# Patient Record
Sex: Male | Born: 1995 | Race: Black or African American | Hispanic: No | Marital: Single | Smoking: Never smoker
Health system: Southern US, Community
[De-identification: ages and names within clinical notes are randomized; demographics above are authoritative.]

## PROBLEM LIST (undated history)

## (undated) DIAGNOSIS — J45909 Unspecified asthma, uncomplicated: Secondary | ICD-10-CM

---
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
## 2017-10-07 IMAGING — CR DG CHEST 2V
2 series · 2 of 2 positions shown · non-contrast
Comparison: [DATE].

CLINICAL DATA: Central chest pain, nausea.

EXAM:
CHEST  2 VIEW

[w chest pa]
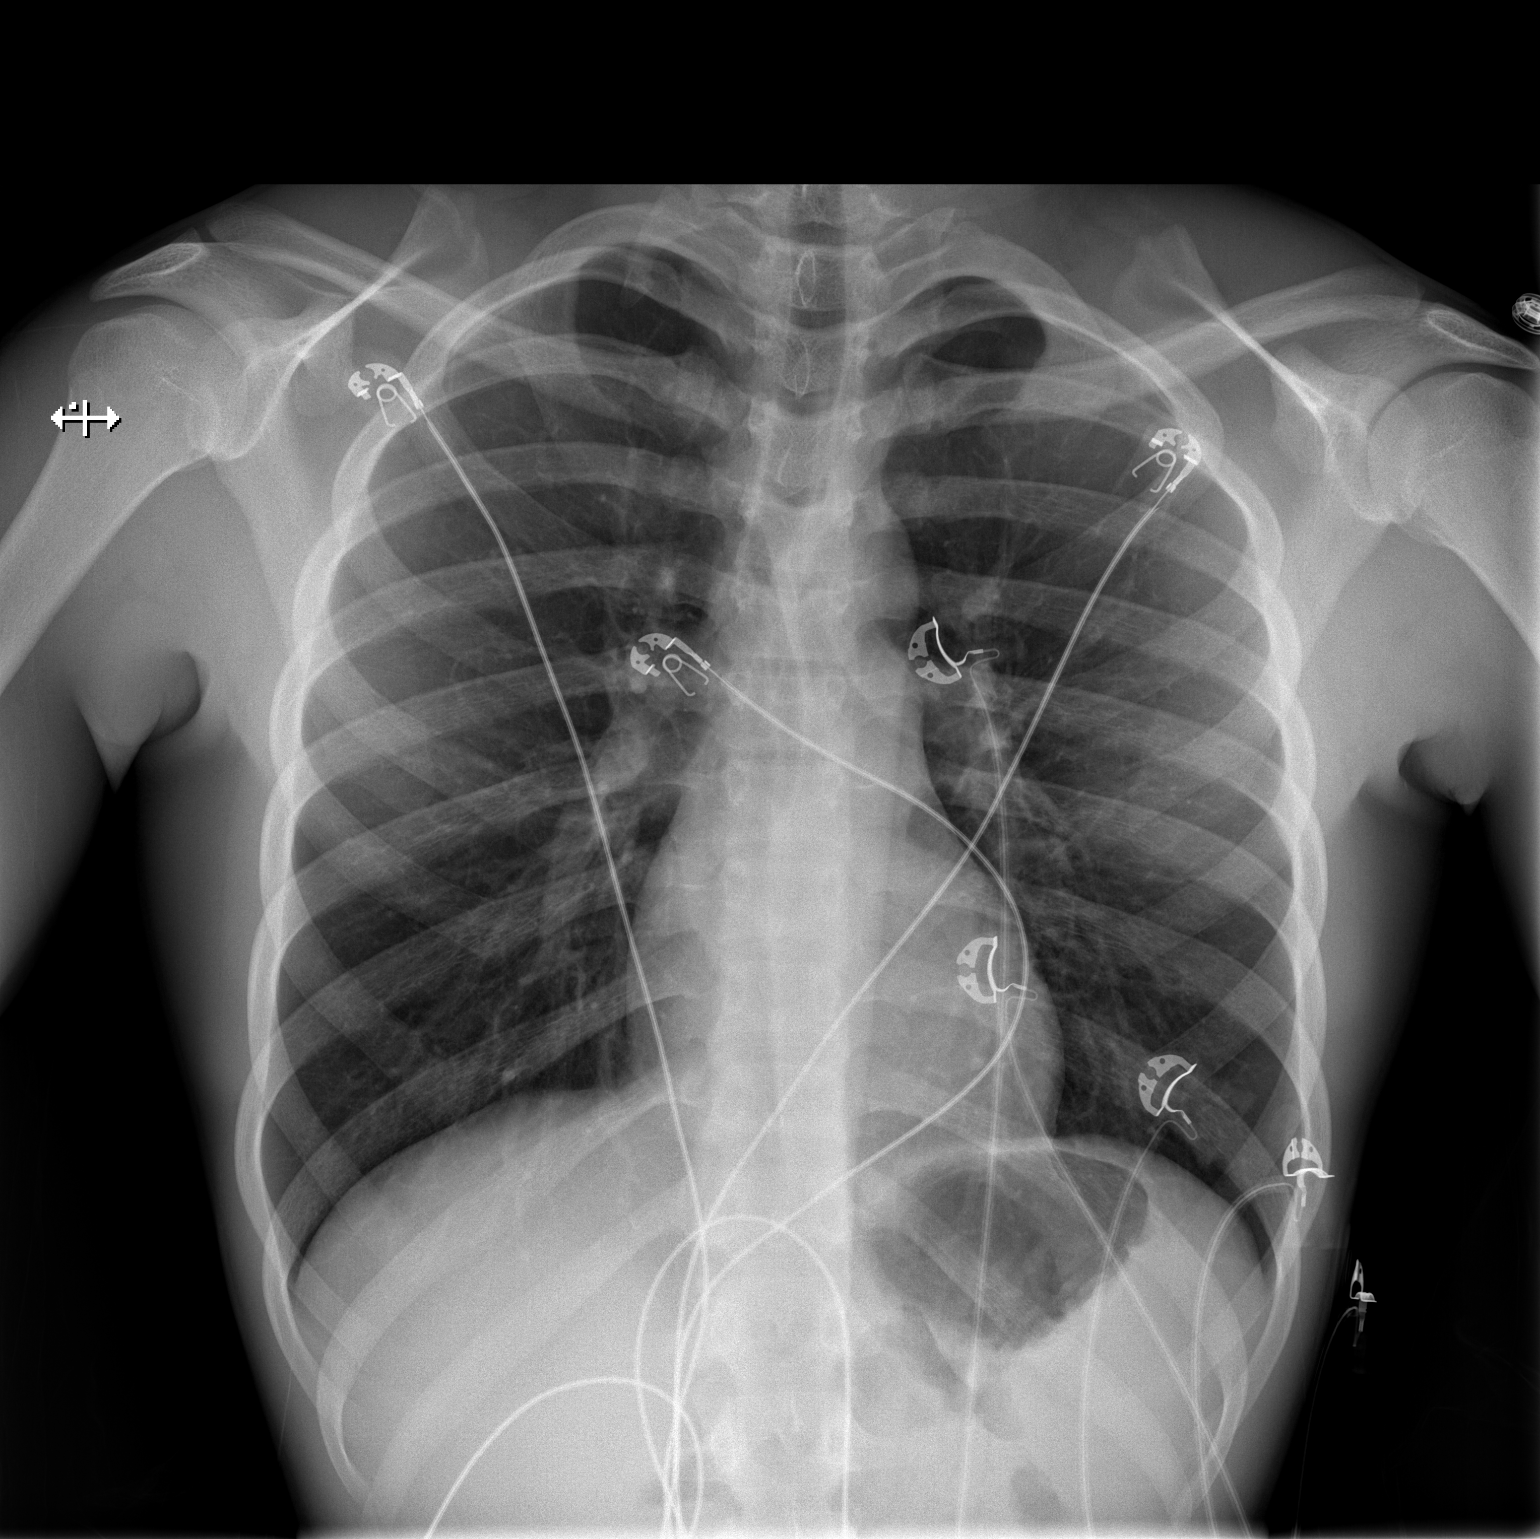

[w chest lat]
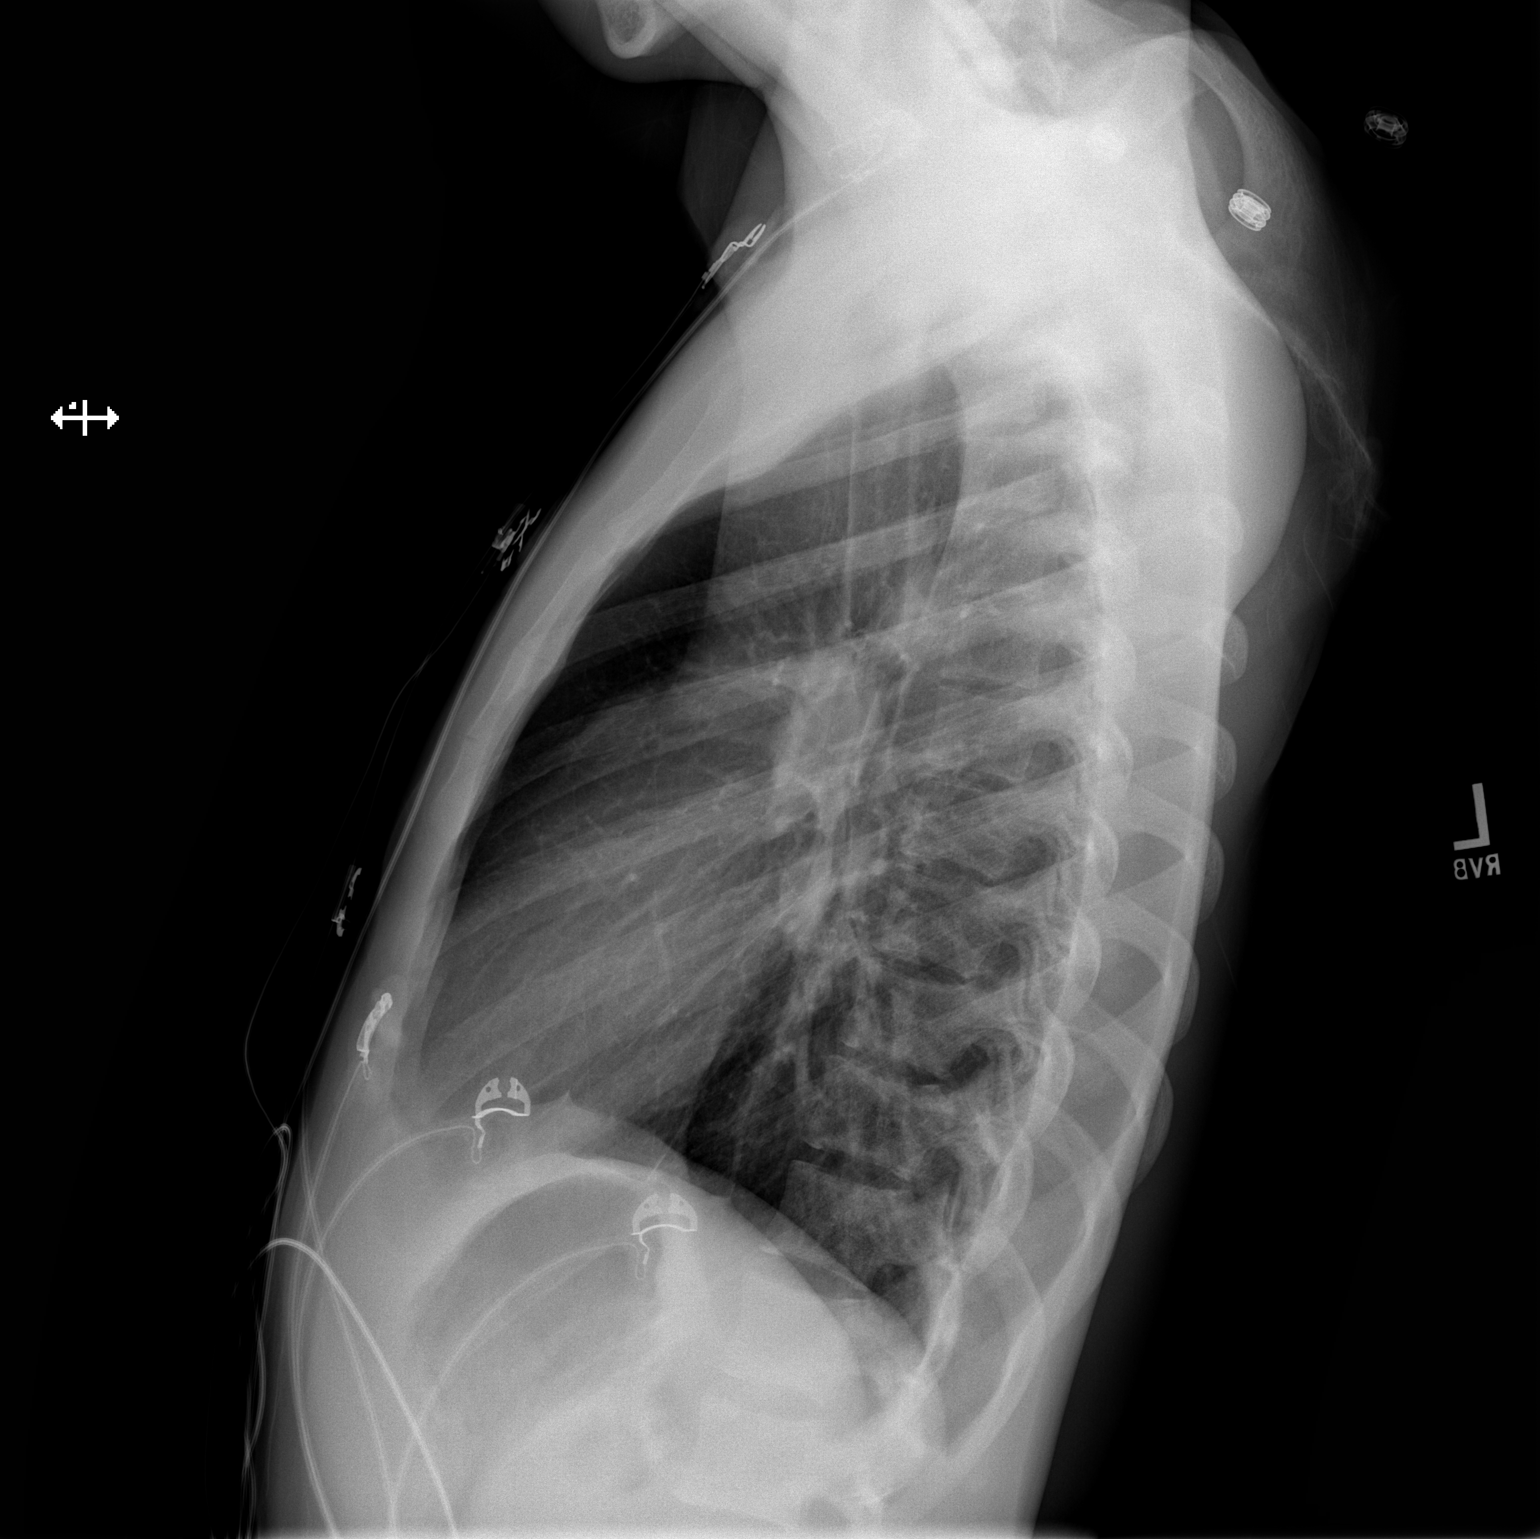

[2 of 2 positions shown; findings below may reference images not displayed]

FINDINGS: Trachea is midline. Heart size normal. Lungs are clear. No pleural
fluid.
IMPRESSION: Negative.

## 2017-11-24 ENCOUNTER — Other Ambulatory Visit: Payer: Self-pay

## 2017-11-24 ENCOUNTER — Encounter (HOSPITAL_COMMUNITY): Payer: Self-pay | Admitting: Emergency Medicine

## 2017-11-24 ENCOUNTER — Ambulatory Visit (HOSPITAL_COMMUNITY)
Admission: EM | Admit: 2017-11-24 | Discharge: 2017-11-24 | Disposition: A | Discharge status: Home / Self Care | Payer: BLUE CROSS/BLUE SHIELD | Attending: Family Medicine | Admitting: Family Medicine

## 2017-11-24 DIAGNOSIS — M25562 Pain in left knee: Secondary | ICD-10-CM | POA: Diagnosis not present

## 2017-11-24 MED ORDER — MELOXICAM 7.5 MG PO TABS: 7.5000 mg | ORAL_TABLET | Freq: Every day | ORAL | 0 refills | Status: AC

## 2017-11-24 NOTE — ED Provider Notes (Signed)
[facility]    CSN: [id number] Arrival date & time: 11/24/17  1333     History   Chief Complaint Chief Complaint  Patient presents with  . Knee Injury    left    HPI Mason Ward is a 22 y.o. male.   22 year old male comes in for 2-day history of left knee pain after injury.  States he was climbing up stairs when 1 of the stair gave way/broke down, causing him to fall.  States was able to catch the fall, but twisted his left knee during the process.  Has still been able to ambulate though with some pain.  Points to the superior knee when asking about pain.  No obvious swelling, contusions.  Has been taking ibuprofen with some relief.  Was able to go to work yesterday despite incident.     Past Medical History:  Diagnosis Date  . Asthma     There are no active problems to display for this patient.   History reviewed. No pertinent surgical history.     Home Medications    Prior to Admission medications   Medication Sig Start Date End Date Taking? Authorizing Provider  Loratadine (CLARITIN) 10 MG CAPS Take by mouth.   Yes [provider]  montelukast (SINGULAIR) 10 MG tablet Take 10 mg by mouth at bedtime.   Yes [provider]  benzonatate (TESSALON) 100 MG capsule Take 1 capsule (100 mg total) by mouth 3 (three) times daily as needed for cough. 04/14/16   Alvina ChouEspina, Francisco Manuel, PA  meloxicam (MOBIC) 7.5 MG tablet Take 1 tablet (7.5 mg total) by mouth daily. 11/24/17   Belinda FisherYu, Artemis Koller V, PA-C    Family History History reviewed. No pertinent family history.  Social History Social History   Tobacco Use  . Smoking status: Never Smoker  . Smokeless tobacco: Never Used  Substance Use Topics  . Alcohol use: No  . Drug use: Not on file     Allergies   Patient has no known allergies.   Review of Systems Review of Systems  Reason unable to perform ROS: See HPI as above.     Physical Exam Triage Vital Signs ED Triage Vitals  Enc  Vitals Group     BP 11/24/17 1401 127/84     Pulse Rate 11/24/17 1401 64     Resp --      Temp 11/24/17 1401 98 F (36.7 C)     Temp Source 11/24/17 1401 Oral     SpO2 11/24/17 1401 100 %     Weight --      Height --      Head Circumference --      Peak Flow --      Pain Score 11/24/17 1402 7     Pain Loc --      Pain Edu? --      Excl. in GC? --    No data found.  Updated Vital Signs BP 127/84 (BP Location: Left Arm)   Pulse 64   Temp 98 F (36.7 C) (Oral)   SpO2 100%   Physical Exam  Constitutional: He is oriented to person, place, and time. He appears well-developed and well-nourished. No distress.  HENT:  Head: Normocephalic and atraumatic.  Eyes: Pupils are equal, round, and reactive to light. Conjunctivae are normal.  Musculoskeletal:  No swelling, erythema, increased warmth,  contusion seen.  Tenderness to palpation superior to patellar. No tenderness to palpation of joint lines. Full ROM of knee.  Strength normal and equal bilaterally. Sensation intact and equal bilaterally. Pedal pulse 2+.   Neurological: He is alert and oriented to person, place, and time.  Skin: He is not diaphoretic.   UC Treatments / Results  Labs (all labs ordered are listed, but only abnormal results are displayed) Labs Reviewed - No data to display  EKG None  Radiology No results found.  Procedures Procedures (including critical care time)  Medications Ordered in UC Medications - No data to display  Initial Impression / Assessment and Plan / UC Course  I have reviewed the triage vital signs and the nursing notes.  Pertinent labs & imaging results that were available during my care of the patient were reviewed by me and considered in my medical decision making (see chart for details).    Patient able to ambulate on own, strength normal and equal bilaterally, no indications for x-ray.  Will have patient try NSAIDs, ice compress, elevation, rest, knee sleeve during activity.   Return precautions given.  Patient expresses understanding and agrees to plan.  Final Clinical Impressions(s) / UC Diagnoses   Final diagnoses:  Acute pain of left knee    ED Prescriptions    Medication Sig Dispense Auth. Provider   meloxicam (MOBIC) 7.5 MG tablet Take 1 tablet (7.5 mg total) by mouth daily. 15 tablet Threasa Alpha, [place] 11/24/17 1501

## 2017-11-24 NOTE — Discharge Instructions (Signed)
Given you are able to walk and good strength of your knee, no xray is indicated. Start mobic, ice compress, elevation, knee sleeve during activity, rest. This may take a few weeks to completely resolve, but should be feeling better each week. Follow up with orthopedics/PCP for further evaluation if symptoms not improving.

## 2017-11-24 NOTE — ED Triage Notes (Signed)
Pt was walking into his house when his stairs gave way.  Pt states he twisted his left knee.

## 2018-03-27 ENCOUNTER — Encounter (HOSPITAL_BASED_OUTPATIENT_CLINIC_OR_DEPARTMENT_OTHER): Payer: Self-pay | Admitting: Emergency Medicine

## 2018-03-27 ENCOUNTER — Other Ambulatory Visit: Payer: Self-pay

## 2018-03-27 ENCOUNTER — Emergency Department (HOSPITAL_BASED_OUTPATIENT_CLINIC_OR_DEPARTMENT_OTHER)
Admission: EM | Admit: 2018-03-27 | Discharge: 2018-03-27 | Disposition: A | Discharge status: Home / Self Care | Payer: BLUE CROSS/BLUE SHIELD | Attending: Emergency Medicine | Admitting: Emergency Medicine

## 2018-03-27 ENCOUNTER — Emergency Department (HOSPITAL_BASED_OUTPATIENT_CLINIC_OR_DEPARTMENT_OTHER): Payer: BLUE CROSS/BLUE SHIELD

## 2018-03-27 DIAGNOSIS — S60012A Contusion of left thumb without damage to nail, initial encounter: Secondary | ICD-10-CM | POA: Diagnosis not present

## 2018-03-27 DIAGNOSIS — Y929 Unspecified place or not applicable: Secondary | ICD-10-CM | POA: Diagnosis not present

## 2018-03-27 DIAGNOSIS — Y939 Activity, unspecified: Secondary | ICD-10-CM | POA: Diagnosis not present

## 2018-03-27 DIAGNOSIS — J45909 Unspecified asthma, uncomplicated: Secondary | ICD-10-CM | POA: Diagnosis not present

## 2018-03-27 DIAGNOSIS — Z79899 Other long term (current) drug therapy: Secondary | ICD-10-CM | POA: Diagnosis not present

## 2018-03-27 DIAGNOSIS — W230XXA Caught, crushed, jammed, or pinched between moving objects, initial encounter: Secondary | ICD-10-CM | POA: Insufficient documentation

## 2018-03-27 DIAGNOSIS — Y999 Unspecified external cause status: Secondary | ICD-10-CM | POA: Insufficient documentation

## 2018-03-27 DIAGNOSIS — S6010XA Contusion of unspecified finger with damage to nail, initial encounter: Secondary | ICD-10-CM

## 2018-03-27 DIAGNOSIS — S6992XA Unspecified injury of left wrist, hand and finger(s), initial encounter: Secondary | ICD-10-CM | POA: Diagnosis present

## 2018-03-27 MED ORDER — IBUPROFEN 800 MG PO TABS
800.0000 mg | ORAL_TABLET | Freq: Once | ORAL | Status: AC
  Administered 2018-03-27: 800 mg via ORAL
  Filled 2018-03-27: qty 1

## 2018-03-27 MED ORDER — IBUPROFEN 800 MG PO TABS: 800.0000 mg | ORAL_TABLET | Freq: Three times a day (TID) | ORAL | 0 refills | Status: AC | PRN

## 2018-03-27 NOTE — ED Triage Notes (Signed)
Patient states a box fell onto his left thumb yesterday at 1600; swelling and redness noted.

## 2018-03-27 NOTE — Discharge Instructions (Addendum)
Wear the splint and follow-up with a hand doctor.  Keep hand elevated, apply ice, anti-inflammatories.  You declined drainage of the bleeding under the nail today.  Return to the ED with new or worsening symptoms.

## 2018-03-27 NOTE — ED Provider Notes (Signed)
[facility] EMERGENCY DEPARTMENT Provider Note   CSN: [id number] Arrival date & time: 03/27/18  0515     History   Chief Complaint Chief Complaint  Patient presents with  . Hand Pain    HPI Mason Ward is a 21 y.o. male.  Patient reports left thumb pain after "30 pound box" fell on his thumb around 4 PM.  Denies having any pain before this.  Has been taking Motrin without relief.  There is some discoloration to his thumbnail.  He has decreased range of motion and pain.  There is no numbness or tingling.  No other injuries.  The history is provided by the patient.  Hand Pain  Pertinent negatives include no chest pain, no abdominal pain and no shortness of breath.    Past Medical History:  Diagnosis Date  . Asthma     There are no active problems to display for this patient.   History reviewed. No pertinent surgical history.      Home Medications    Prior to Admission medications   Medication Sig Start Date End Date Taking? Authorizing Provider  benzonatate (TESSALON) 100 MG capsule Take 1 capsule (100 mg total) by mouth 3 (three) times daily as needed for cough. 04/14/16   Espina, Lucita Lora, PA  Loratadine (CLARITIN) 10 MG CAPS Take by mouth.    [provider]  meloxicam (MOBIC) 7.5 MG tablet Take 1 tablet (7.5 mg total) by mouth daily. 11/24/17   Cathie Hoops, Amy V, PA-C  montelukast (SINGULAIR) 10 MG tablet Take 10 mg by mouth at bedtime.    [provider]    Family History History reviewed. No pertinent family history.  Social History Social History   Tobacco Use  . Smoking status: Never Smoker  . Smokeless tobacco: Never Used  Substance Use Topics  . Alcohol use: No  . Drug use: Not on file     Allergies   Patient has no known allergies.   Review of Systems Review of Systems  Constitutional: Negative for activity change, appetite change and fever.  HENT: Negative for congestion and rhinorrhea.   Respiratory:  Negative for chest tightness and shortness of breath.   Cardiovascular: Negative for chest pain.  Gastrointestinal: Negative for abdominal pain, nausea and vomiting.  Genitourinary: Negative for dysuria and hematuria.  Musculoskeletal: Positive for arthralgias and myalgias.  Skin: Negative for rash.  Neurological: Negative for dizziness, light-headedness and numbness.   all other systems are negative except as noted in the HPI and PMH.     Physical Exam Updated Vital Signs BP (!) 134/91 (BP Location: Right Arm)   Pulse 73   Temp 98.3 F (36.8 C) (Oral)   Resp 16   Ht 5\' 10"  (1.778 m)   Wt 61.2 kg   SpO2 100%   BMI 19.37 kg/m   Physical Exam  Constitutional: He is oriented to person, place, and time. He appears well-developed and well-nourished. No distress.  HENT:  Head: Normocephalic and atraumatic.  Mouth/Throat: Oropharynx is clear and moist. No oropharyngeal exudate.  Eyes: Pupils are equal, round, and reactive to light. Conjunctivae and EOM are normal.  Neck: Normal range of motion. Neck supple.  No meningismus.  Cardiovascular: Normal rate, regular rhythm, normal heart sounds and intact distal pulses.  No murmur heard. Pulmonary/Chest: Effort normal and breath sounds normal. No respiratory distress.  Abdominal: Soft. There is no tenderness. There is no rebound and no guarding.  Musculoskeletal: Normal range of motion. He exhibits tenderness. He  exhibits no edema.  Left thumb distal phalanx is tender to palpation with reduced range of motion of the IP joint.  There is no pain at base of first metacarpal or snuffbox. There is subungual hematoma over proximal one third of left thumbnail  Neurological: He is alert and oriented to person, place, and time. No cranial nerve deficit. He exhibits normal muscle tone. Coordination normal.  No ataxia on finger to nose bilaterally. No pronator drift. 5/5 strength throughout. CN 2-12 intact.Equal grip strength. Sensation intact.     Skin: Skin is warm.  Psychiatric: He has a normal mood and affect. His behavior is normal.  Nursing note and vitals reviewed.    ED Treatments / Results  Labs (all labs ordered are listed, but only abnormal results are displayed) Labs Reviewed - No data to display  EKG None  Radiology Dg Finger Thumb Left  Result Date: 03/27/2018 CLINICAL DATA:  Box fell on patient's left thumb yesterday, with pain, swelling and erythema. Initial encounter. EXAM: LEFT THUMB 2+V COMPARISON:  None. FINDINGS: There is no evidence of fracture or dislocation. Visualized joint spaces are preserved. Known soft tissue injury is not well characterized on radiograph. No radiopaque foreign bodies are seen. IMPRESSION: No evidence of fracture or dislocation. Electronically Signed   By: Roanna RaiderJeffery  Chang M.D.   On: 03/27/2018 06:07    Procedures Procedures (including critical care time)  Medications Ordered in ED Medications - No data to display   Initial Impression / Assessment and Plan / ED Course  I have reviewed the triage vital signs and the nursing notes.  Pertinent labs & imaging results that were available during my care of the patient were reviewed by me and considered in my medical decision making (see chart for details).    Blunt trauma with subungual hematoma. Neurovascularly intact.   X-rays negative for fracture or dislocation. Patient declines trephination of subungual hematoma at this time.  He will be given splint, ice, elevation, hand follow-up.  Return precautions discussed.  Final Clinical Impressions(s) / ED Diagnoses   Final diagnoses:  Contusion of left thumb without damage to nail, initial encounter  Subungual hematoma of digit of hand, initial encounter    ED Discharge Orders    None       Jaree Trinka, Jeannett SeniorStephen, MD 03/27/18 [phone]
